# Patient Record
Sex: Male | Born: 1950 | Race: Black or African American | Hispanic: No | Marital: Single | Smoking: Former smoker
Health system: Southern US, Community
[De-identification: ages and names within clinical notes are randomized; demographics above are authoritative.]

## PROBLEM LIST (undated history)

## (undated) DIAGNOSIS — I2510 Atherosclerotic heart disease of native coronary artery without angina pectoris: Secondary | ICD-10-CM

## (undated) DIAGNOSIS — G4700 Insomnia, unspecified: Secondary | ICD-10-CM

## (undated) DIAGNOSIS — F0390 Unspecified dementia without behavioral disturbance: Secondary | ICD-10-CM

## (undated) DIAGNOSIS — K469 Unspecified abdominal hernia without obstruction or gangrene: Secondary | ICD-10-CM

## (undated) DIAGNOSIS — I10 Essential (primary) hypertension: Secondary | ICD-10-CM

## (undated) DIAGNOSIS — F1010 Alcohol abuse, uncomplicated: Secondary | ICD-10-CM

---

## 2004-01-20 ENCOUNTER — Emergency Department (HOSPITAL_COMMUNITY): Admission: EM | Admit: 2004-01-20 | Discharge: 2004-01-20 | Payer: Self-pay | Admitting: Emergency Medicine

## 2007-07-08 ENCOUNTER — Emergency Department (HOSPITAL_COMMUNITY): Admission: EM | Admit: 2007-07-08 | Discharge: 2007-07-08 | Payer: Self-pay | Admitting: Emergency Medicine

## 2008-04-09 ENCOUNTER — Encounter: Admission: RE | Admit: 2008-04-09 | Discharge: 2008-04-09 | Payer: Self-pay | Admitting: Gastroenterology

## 2010-10-30 ENCOUNTER — Encounter: Payer: Self-pay | Admitting: Gastroenterology

## 2011-03-24 ENCOUNTER — Emergency Department (HOSPITAL_COMMUNITY)
Admission: EM | Admit: 2011-03-24 | Discharge: 2011-03-24 | Disposition: A | Discharge status: Home / Self Care | Payer: Medicare Other | Attending: Emergency Medicine | Admitting: Emergency Medicine

## 2011-03-24 ENCOUNTER — Emergency Department (HOSPITAL_COMMUNITY): Payer: Medicare Other

## 2011-03-24 DIAGNOSIS — E119 Type 2 diabetes mellitus without complications: Secondary | ICD-10-CM | POA: Insufficient documentation

## 2011-03-24 DIAGNOSIS — M25539 Pain in unspecified wrist: Secondary | ICD-10-CM | POA: Insufficient documentation

## 2011-03-24 DIAGNOSIS — Z23 Encounter for immunization: Secondary | ICD-10-CM | POA: Insufficient documentation

## 2011-03-24 DIAGNOSIS — W208XXA Other cause of strike by thrown, projected or falling object, initial encounter: Secondary | ICD-10-CM | POA: Insufficient documentation

## 2011-03-24 DIAGNOSIS — S52609A Unspecified fracture of lower end of unspecified ulna, initial encounter for closed fracture: Secondary | ICD-10-CM | POA: Insufficient documentation

## 2011-03-24 DIAGNOSIS — I2510 Atherosclerotic heart disease of native coronary artery without angina pectoris: Secondary | ICD-10-CM | POA: Insufficient documentation

## 2011-07-20 LAB — DIFFERENTIAL
Eosinophils Relative: 2
Lymphocytes Relative: 32
Lymphs Abs: 1.8
Monocytes Absolute: 0.9 — ABNORMAL HIGH

## 2011-07-20 LAB — CBC
HCT: 43.1
Hemoglobin: 14.5
Platelets: 186
RBC: 5
WBC: 5.6

## 2011-07-20 LAB — BASIC METABOLIC PANEL
Chloride: 105
Creatinine, Ser: 0.79
GFR calc Af Amer: >60
Potassium: 3.6

## 2011-07-20 LAB — URINALYSIS, ROUTINE W REFLEX MICROSCOPIC
Glucose, UA: >1000 — AB
Ketones, ur: NEGATIVE
Leukocytes, UA: NEGATIVE
pH: 6

## 2012-06-09 ENCOUNTER — Emergency Department (HOSPITAL_COMMUNITY)
Admission: EM | Admit: 2012-06-09 | Discharge: 2012-06-10 | Disposition: A | Discharge status: Skilled Nursing Facility | Payer: Medicare Other | Attending: Emergency Medicine | Admitting: Emergency Medicine

## 2012-06-09 ENCOUNTER — Encounter (HOSPITAL_COMMUNITY): Payer: Self-pay | Admitting: *Deleted

## 2012-06-09 DIAGNOSIS — F919 Conduct disorder, unspecified: Secondary | ICD-10-CM | POA: Insufficient documentation

## 2012-06-09 DIAGNOSIS — F068 Other specified mental disorders due to known physiological condition: Secondary | ICD-10-CM | POA: Insufficient documentation

## 2012-06-09 DIAGNOSIS — Z79899 Other long term (current) drug therapy: Secondary | ICD-10-CM | POA: Insufficient documentation

## 2012-06-09 DIAGNOSIS — I10 Essential (primary) hypertension: Secondary | ICD-10-CM | POA: Insufficient documentation

## 2012-06-09 DIAGNOSIS — E119 Type 2 diabetes mellitus without complications: Secondary | ICD-10-CM | POA: Insufficient documentation

## 2012-06-09 DIAGNOSIS — I2510 Atherosclerotic heart disease of native coronary artery without angina pectoris: Secondary | ICD-10-CM | POA: Insufficient documentation

## 2012-06-09 DIAGNOSIS — F0390 Unspecified dementia without behavioral disturbance: Secondary | ICD-10-CM

## 2012-06-09 DIAGNOSIS — F29 Unspecified psychosis not due to a substance or known physiological condition: Secondary | ICD-10-CM | POA: Insufficient documentation

## 2012-06-09 HISTORY — DX: Unspecified dementia, unspecified severity, without behavioral disturbance, psychotic disturbance, mood disturbance, and anxiety: F03.90

## 2012-06-09 HISTORY — DX: Atherosclerotic heart disease of native coronary artery without angina pectoris: I25.10

## 2012-06-09 HISTORY — DX: Insomnia, unspecified: G47.00

## 2012-06-09 HISTORY — DX: Essential (primary) hypertension: I10

## 2012-06-09 HISTORY — DX: Unspecified abdominal hernia without obstruction or gangrene: K46.9

## 2012-06-09 LAB — CBC WITH DIFFERENTIAL/PLATELET
Eosinophils Absolute: 0.2 10*3/uL (ref 0.0–0.7)
Hemoglobin: 14.1 g/dL (ref 13.0–17.0)
Lymphs Abs: 1.9 10*3/uL (ref 0.7–4.0)
MCH: 29.1 pg (ref 26.0–34.0)
Monocytes Relative: 15 % — ABNORMAL HIGH (ref 3–12)
Neutro Abs: 3.5 10*3/uL (ref 1.7–7.7)
Neutrophils Relative %: 53 % (ref 43–77)
RBC: 4.84 MIL/uL (ref 4.22–5.81)

## 2012-06-09 LAB — RAPID URINE DRUG SCREEN, HOSP PERFORMED: Opiates: NOT DETECTED

## 2012-06-09 LAB — COMPREHENSIVE METABOLIC PANEL
Alkaline Phosphatase: 80 U/L (ref 39–117)
BUN: 11 mg/dL (ref 6–23)
CO2: 27 mEq/L (ref 19–32)
Chloride: 100 mEq/L (ref 96–112)
GFR calc Af Amer: >90 mL/min (ref >90)
GFR calc non Af Amer: >90 mL/min (ref >90)
Glucose, Bld: 195 mg/dL — ABNORMAL HIGH (ref 70–99)
Potassium: 4.2 mEq/L (ref 3.5–5.1)
Total Bilirubin: 0.2 mg/dL — ABNORMAL LOW (ref 0.3–1.2)

## 2012-06-09 MED ORDER — DONEPEZIL HCL 5 MG PO TABS
5.0000 mg | ORAL_TABLET | Freq: Every day | ORAL | Status: DC
  Filled 2012-06-09: qty 1

## 2012-06-09 MED ORDER — ASPIRIN 81 MG PO CHEW
81.0000 mg | CHEWABLE_TABLET | Freq: Every day | ORAL | Status: DC
  Administered 2012-06-09: 81 mg via ORAL
  Filled 2012-06-09: qty 1

## 2012-06-09 MED ORDER — CLOPIDOGREL BISULFATE 75 MG PO TABS
75.0000 mg | ORAL_TABLET | Freq: Every day | ORAL | Status: DC
  Filled 2012-06-09: qty 1

## 2012-06-09 MED ORDER — ACETAMINOPHEN 325 MG PO TABS: 650.0000 mg | ORAL_TABLET | ORAL | Status: DC | PRN

## 2012-06-09 MED ORDER — GLUCOSE-VITAMIN C 4-6 GM-MG PO CHEW: 1.0000 | CHEWABLE_TABLET | Freq: Every day | ORAL | Status: DC

## 2012-06-09 MED ORDER — NICOTINE 21 MG/24HR TD PT24
21.0000 mg | MEDICATED_PATCH | Freq: Every day | TRANSDERMAL | Status: DC
  Administered 2012-06-09: 21 mg via TRANSDERMAL
  Filled 2012-06-09: qty 1

## 2012-06-09 MED ORDER — METFORMIN HCL 500 MG PO TABS
1000.0000 mg | ORAL_TABLET | Freq: Three times a day (TID) | ORAL | Status: DC
  Filled 2012-06-09 (×2): qty 2

## 2012-06-09 MED ORDER — CLOPIDOGREL BISULFATE 75 MG PO TABS
75.0000 mg | ORAL_TABLET | Freq: Every day | ORAL | Status: DC
  Filled 2012-06-09 (×2): qty 1

## 2012-06-09 MED ORDER — METFORMIN HCL 500 MG PO TABS: 1000.0000 mg | ORAL_TABLET | Freq: Every day | ORAL | Status: DC

## 2012-06-09 MED ORDER — LORAZEPAM 1 MG PO TABS: 1.0000 mg | ORAL_TABLET | Freq: Three times a day (TID) | ORAL | Status: DC | PRN
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
## 2012-06-09 NOTE — ED Notes (Signed)
[id number]> Expected date:06/09/12<BR> Expected time: 5:47 PM<BR> Means of arrival:<BR> Comments:<BR>
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
## 2012-06-09 NOTE — ED Notes (Signed)
Patient has one patient belongings bag containing one pair of underwear, one shirt, tennis shoes, one pair of socks, one black necklace, one pack of cigarettes, one lighter, one billfold, and one pair of sweatpants. Belongings and patient wanded by security.

## 2012-06-09 NOTE — ED Notes (Signed)
Patient wanded by security and pt placed in paper scrubs
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
## 2012-06-10 LAB — URINALYSIS, ROUTINE W REFLEX MICROSCOPIC
Hgb urine dipstick: NEGATIVE
Leukocytes, UA: NEGATIVE
Nitrite: NEGATIVE
Protein, ur: 100 mg/dL — AB
Specific Gravity, Urine: 1.01 (ref 1.005–1.030)
Urobilinogen, UA: 2 mg/dL — ABNORMAL HIGH (ref 0.0–1.0)

## 2012-06-10 LAB — URINE MICROSCOPIC-ADD ON

## 2012-06-10 NOTE — ED Provider Notes (Signed)
The patient had a toe.  Psych consultation.  The psychiatrist, stated that the patient was safe to be released back to the skilled nursing facility.  I went back to reassess.  The patient is not psychotic.  There is no risk of suicidal or homicidal actions  Cheri Guppy, MD 06/10/12 [phone]

## 2012-06-12 NOTE — ED Provider Notes (Signed)
Medical screening examination/treatment/procedure(s) were performed by non-physician practitioner and as supervising physician I was immediately available for consultation/collaboration.  Bethany Cumming T Azael Ragain, MD 06/12/12 2057 

## 2012-11-11 ENCOUNTER — Encounter (HOSPITAL_COMMUNITY): Payer: Self-pay | Admitting: *Deleted

## 2012-11-11 ENCOUNTER — Emergency Department (HOSPITAL_COMMUNITY)
Admission: EM | Admit: 2012-11-11 | Discharge: 2012-11-11 | Disposition: A | Discharge status: Skilled Nursing Facility | Payer: Medicare Other | Attending: Emergency Medicine | Admitting: Emergency Medicine

## 2012-11-11 DIAGNOSIS — Z8719 Personal history of other diseases of the digestive system: Secondary | ICD-10-CM | POA: Insufficient documentation

## 2012-11-11 DIAGNOSIS — Z79899 Other long term (current) drug therapy: Secondary | ICD-10-CM | POA: Insufficient documentation

## 2012-11-11 DIAGNOSIS — E119 Type 2 diabetes mellitus without complications: Secondary | ICD-10-CM | POA: Insufficient documentation

## 2012-11-11 DIAGNOSIS — I2510 Atherosclerotic heart disease of native coronary artery without angina pectoris: Secondary | ICD-10-CM | POA: Insufficient documentation

## 2012-11-11 DIAGNOSIS — E559 Vitamin D deficiency, unspecified: Secondary | ICD-10-CM | POA: Insufficient documentation

## 2012-11-11 DIAGNOSIS — F0390 Unspecified dementia without behavioral disturbance: Secondary | ICD-10-CM | POA: Insufficient documentation

## 2012-11-11 DIAGNOSIS — I10 Essential (primary) hypertension: Secondary | ICD-10-CM | POA: Insufficient documentation

## 2012-11-11 DIAGNOSIS — Z7982 Long term (current) use of aspirin: Secondary | ICD-10-CM | POA: Insufficient documentation

## 2012-11-11 DIAGNOSIS — H04209 Unspecified epiphora, unspecified lacrimal gland: Secondary | ICD-10-CM | POA: Insufficient documentation

## 2012-11-11 DIAGNOSIS — K006 Disturbances in tooth eruption: Secondary | ICD-10-CM | POA: Insufficient documentation

## 2012-11-11 DIAGNOSIS — Z87898 Personal history of other specified conditions: Secondary | ICD-10-CM | POA: Insufficient documentation

## 2012-11-11 HISTORY — DX: Alcohol abuse, uncomplicated: F10.10

## 2012-11-11 LAB — POCT I-STAT, CHEM 8
BUN: 15 mg/dL (ref 6–23)
Calcium, Ion: 1.17 mmol/L (ref 1.13–1.30)
Chloride: 109 mEq/L (ref 96–112)
Creatinine, Ser: 1 mg/dL (ref 0.50–1.35)
TCO2: 26 mmol/L (ref 0–100)

## 2012-11-11 MED ORDER — LISINOPRIL 10 MG PO TABS
10.0000 mg | ORAL_TABLET | Freq: Every day | ORAL | Status: DC
  Administered 2012-11-11: 10 mg via ORAL
  Filled 2012-11-11: qty 1

## 2012-11-11 MED ORDER — LISINOPRIL 10 MG PO TABS: 10.0000 mg | ORAL_TABLET | Freq: Every day | ORAL | Status: DC

## 2012-11-11 NOTE — ED Provider Notes (Signed)
I saw and evaluated the patient, reviewed the resident's note and I agree with the findings and plan. Patient is here with the complaint of a mild headache and persistent hypertension. He had no deficits concerning for TIA today. He is well-appearing and has no deficits currently. Patient is currently not taking any medication for hypertension even though he has a history and should be on something. Looking back at prior notes he is been persistently hypertensive. Do not feel this is hypertensive emergency. Patient was given medication.  Gwyneth Sprout, MD 11/11/12 [phone]

## 2012-11-11 NOTE — ED Notes (Signed)
[id number]<JY> Expected date:<BR> Expected time:<BR> Means of arrival:<BR> Comments:<BR> Ems/ headache/ blurred vision

## 2012-11-11 NOTE — ED Provider Notes (Signed)
History     CSN: [id number]  Arrival date & time 11/11/12  1226   First MD Initiated Contact with Patient 11/11/12 1243      Chief Complaint  Patient presents with  . Headache    (Consider location/radiation/quality/duration/timing/severity/associated sxs/prior treatment) HPI Comments: 62 y.o PMH dementia, DM, HTN, CAD, insomnia, hernia, alcohol abuse, vitamin d deficiency, hernia asthma.  He presents from [facility].  He denies complaints at this time.  He states he put his head down to rest earlier and he was thinking.  He denies h/a, resolved blurry vision (baseline wears glasses).  He stated then he ended up in the ED.  ROS: negative for h/a, eye pain though his eyes are watery, resolved blurry vision, denies chest pain/sob.  Spoke with there nursing home they state that the patient stated he was not feeling well and put his head down.  His vital were checked and his BP was 180/118.  He is not on any antihypertensives.      The history is provided by the patient and the nursing home. No language interpreter was used.    Past Medical History  Diagnosis Date  . Dementia   . Diabetes mellitus   . Hypertension   . Coronary artery disease   . Insomnia   . Hernia   . Alcohol abuse     History reviewed. No pertinent past surgical history.  History reviewed. No pertinent family history.  History  Substance Use Topics  . Smoking status: Not on file  . Smokeless tobacco: Not on file  . Alcohol Use: No      Review of Systems  Constitutional: Negative for appetite change.  Eyes:       +eyes watering Blurred vision resolved.   Respiratory: Negative for shortness of breath.   Cardiovascular: Negative for chest pain.  Gastrointestinal: Negative for nausea, vomiting and abdominal pain.    Allergies  Review of patient's allergies indicates no known allergies.  Home Medications   Current Outpatient Rx  Name  Route  Sig  Dispense  Refill  . ASPIRIN 81 MG PO CHEW    Oral   Chew 81 mg by mouth daily.         Marland Kitchen VITAMIN D 1000 UNITS PO TABS   Oral   Take 1,000 Units by mouth daily.         Marland Kitchen CLOPIDOGREL BISULFATE 75 MG PO TABS   Oral   Take 75 mg by mouth daily.         . DONEPEZIL HCL 5 MG PO TABS   Oral   Take 5 mg by mouth daily.         Marland Kitchen GLIPIZIDE 5 MG PO TABS   Oral   Take 5 mg by mouth 2 (two) times daily.         Marland Kitchen METFORMIN HCL 1000 MG PO TABS   Oral   Take 1,000 mg by mouth 3 (three) times daily.         . ADULT MULTIVITAMIN W/MINERALS CH   Oral   Take 1 tablet by mouth daily.           BP 168/130  Pulse 86  Temp 97.5 F (36.4 C) (Oral)  Resp 20  SpO2 99%  Physical Exam  Nursing note and vitals reviewed. Constitutional: He is oriented to person, place, and time. He appears well-developed and well-nourished. He is cooperative. No distress.  HENT:  Head: Normocephalic and atraumatic.  Mouth/Throat: Oropharynx is  clear and moist and mucous membranes are normal. Abnormal dentition. No oropharyngeal exudate.  Eyes: Conjunctivae normal are normal. Pupils are equal, round, and reactive to light. Right eye exhibits no discharge. Left eye exhibits no discharge. No scleral icterus.  Cardiovascular: Normal rate, regular rhythm, S1 normal, S2 normal and normal heart sounds.   No murmur heard. Pulmonary/Chest: Effort normal and breath sounds normal. No respiratory distress. He has no wheezes.  Abdominal: Soft. Bowel sounds are normal. He exhibits no distension. There is no tenderness.  Neurological: He is alert and oriented to person, place, and time. He has normal strength. No cranial nerve deficit or sensory deficit.  Skin: Skin is warm, dry and intact. No rash noted. He is not diaphoretic.  Psychiatric: He has a normal mood and affect. His speech is normal and behavior is normal. Thought content normal.       Oriented to year, not month, not age.  Aware this is his birthday month when he was told it was Feb.       ED Course  Procedures (including critical care time)  Labs Reviewed - No data to display No results found.   No diagnosis found.    MDM  Hypertension -neurologically intact  1. With history of DM will start ACEI, istat to check kidney function which was normal  2. Follow up with PCP at [facility] who can titrate BP medication as needed within the next 1 week   Shirlee Latch MD [phone]         Annett Gula, MD 11/11/12 1335  Annett Gula, MD 11/11/12 [phone]

## 2012-11-11 NOTE — ED Notes (Signed)
Per EMS:  Pt from [facility].  States that he started to have a HA and blurred vision that started today.  Denies N/V.  Pt did take medications as usual today-HTN.

## 2014-07-19 ENCOUNTER — Encounter (HOSPITAL_COMMUNITY): Payer: Self-pay | Admitting: Emergency Medicine

## 2014-07-19 ENCOUNTER — Emergency Department (HOSPITAL_COMMUNITY)
Admission: EM | Admit: 2014-07-19 | Discharge: 2014-07-20 | Disposition: A | Discharge status: Home / Self Care | Payer: Medicare Other | Attending: Emergency Medicine | Admitting: Emergency Medicine

## 2014-07-19 ENCOUNTER — Emergency Department (HOSPITAL_COMMUNITY): Payer: Medicare Other

## 2014-07-19 DIAGNOSIS — Z8669 Personal history of other diseases of the nervous system and sense organs: Secondary | ICD-10-CM | POA: Diagnosis not present

## 2014-07-19 DIAGNOSIS — F0391 Unspecified dementia with behavioral disturbance: Secondary | ICD-10-CM | POA: Diagnosis not present

## 2014-07-19 DIAGNOSIS — Z7902 Long term (current) use of antithrombotics/antiplatelets: Secondary | ICD-10-CM | POA: Diagnosis not present

## 2014-07-19 DIAGNOSIS — Z8719 Personal history of other diseases of the digestive system: Secondary | ICD-10-CM | POA: Diagnosis not present

## 2014-07-19 DIAGNOSIS — Z79899 Other long term (current) drug therapy: Secondary | ICD-10-CM | POA: Diagnosis not present

## 2014-07-19 DIAGNOSIS — Z7982 Long term (current) use of aspirin: Secondary | ICD-10-CM | POA: Insufficient documentation

## 2014-07-19 DIAGNOSIS — E119 Type 2 diabetes mellitus without complications: Secondary | ICD-10-CM | POA: Insufficient documentation

## 2014-07-19 DIAGNOSIS — R4182 Altered mental status, unspecified: Secondary | ICD-10-CM | POA: Diagnosis present

## 2014-07-19 DIAGNOSIS — I10 Essential (primary) hypertension: Secondary | ICD-10-CM | POA: Diagnosis not present

## 2014-07-19 DIAGNOSIS — F4324 Adjustment disorder with disturbance of conduct: Secondary | ICD-10-CM | POA: Diagnosis present

## 2014-07-19 DIAGNOSIS — I2510 Atherosclerotic heart disease of native coronary artery without angina pectoris: Secondary | ICD-10-CM | POA: Diagnosis not present

## 2014-07-19 DIAGNOSIS — Z720 Tobacco use: Secondary | ICD-10-CM | POA: Insufficient documentation

## 2014-07-19 DIAGNOSIS — R451 Restlessness and agitation: Secondary | ICD-10-CM | POA: Diagnosis present

## 2014-07-19 LAB — CBC WITH DIFFERENTIAL/PLATELET
BASOS ABS: 0 10*3/uL (ref 0.0–0.1)
BASOS PCT: 0 % (ref 0–1)
EOS ABS: 0 10*3/uL (ref 0.0–0.7)
Eosinophils Relative: 0 % (ref 0–5)
HCT: 39.1 % (ref 39.0–52.0)
Hemoglobin: 13.2 g/dL (ref 13.0–17.0)
LYMPHS ABS: 0.6 10*3/uL — AB (ref 0.7–4.0)
Lymphocytes Relative: 9 % — ABNORMAL LOW (ref 12–46)
MCH: 28.6 pg (ref 26.0–34.0)
MCHC: 33.8 g/dL (ref 30.0–36.0)
MCV: 84.6 fL (ref 78.0–100.0)
Monocytes Absolute: 0.6 10*3/uL (ref 0.1–1.0)
Monocytes Relative: 8 % (ref 3–12)
NEUTROS PCT: 83 % — AB (ref 43–77)
Neutro Abs: 6.1 10*3/uL (ref 1.7–7.7)
PLATELETS: 223 10*3/uL (ref 150–400)
RBC: 4.62 MIL/uL (ref 4.22–5.81)
RDW: 13.1 % (ref 11.5–15.5)
WBC: 7.3 10*3/uL (ref 4.0–10.5)

## 2014-07-19 LAB — URINALYSIS, ROUTINE W REFLEX MICROSCOPIC
BILIRUBIN URINE: NEGATIVE
Ketones, ur: NEGATIVE mg/dL
Leukocytes, UA: NEGATIVE
Nitrite: POSITIVE — AB
PH: 5.5 (ref 5.0–8.0)
Protein, ur: 100 mg/dL — AB
SPECIFIC GRAVITY, URINE: 1.015 (ref 1.005–1.030)
Urobilinogen, UA: 1 mg/dL (ref 0.0–1.0)

## 2014-07-19 LAB — URINE MICROSCOPIC-ADD ON

## 2014-07-19 LAB — RAPID URINE DRUG SCREEN, HOSP PERFORMED
AMPHETAMINES: NOT DETECTED
Barbiturates: NOT DETECTED
Benzodiazepines: NOT DETECTED
COCAINE: NOT DETECTED
OPIATES: NOT DETECTED
TETRAHYDROCANNABINOL: NOT DETECTED

## 2014-07-19 LAB — COMPREHENSIVE METABOLIC PANEL
ALBUMIN: 3.2 g/dL — AB (ref 3.5–5.2)
ALK PHOS: 86 U/L (ref 39–117)
ALT: 20 U/L (ref 0–53)
AST: 28 U/L (ref 0–37)
Anion gap: 14 (ref 5–15)
BUN: 9 mg/dL (ref 6–23)
CO2: 24 mEq/L (ref 19–32)
Calcium: 9.3 mg/dL (ref 8.4–10.5)
Chloride: 100 mEq/L (ref 96–112)
Creatinine, Ser: 1 mg/dL (ref 0.50–1.35)
GFR calc Af Amer: 90 mL/min — ABNORMAL LOW (ref >90)
GFR calc non Af Amer: 78 mL/min — ABNORMAL LOW (ref >90)
GLUCOSE: 251 mg/dL — AB (ref 70–99)
POTASSIUM: 3.9 meq/L (ref 3.7–5.3)
SODIUM: 138 meq/L (ref 137–147)
TOTAL PROTEIN: 7.8 g/dL (ref 6.0–8.3)
Total Bilirubin: 0.3 mg/dL (ref 0.3–1.2)

## 2014-07-19 LAB — CBG MONITORING, ED: Glucose-Capillary: 258 mg/dL — ABNORMAL HIGH (ref 70–99)

## 2014-07-19 LAB — ETHANOL

## 2014-07-19 MED ORDER — NICOTINE 21 MG/24HR TD PT24
21.0000 mg | MEDICATED_PATCH | Freq: Every day | TRANSDERMAL | Status: DC
  Administered 2014-07-19 – 2014-07-20 (×2): 21 mg via TRANSDERMAL
  Filled 2014-07-19 (×2): qty 1

## 2014-07-19 MED ORDER — ALUM & MAG HYDROXIDE-SIMETH 200-200-20 MG/5ML PO SUSP: 30.0000 mL | ORAL | Status: DC | PRN

## 2014-07-19 MED ORDER — IBUPROFEN 200 MG PO TABS
600.0000 mg | ORAL_TABLET | Freq: Three times a day (TID) | ORAL | Status: DC | PRN
Start: 2014-07-19 — End: 2014-07-20
  Administered 2014-07-19: 600 mg via ORAL
  Filled 2014-07-19: qty 3

## 2014-07-19 MED ORDER — ONDANSETRON HCL 4 MG PO TABS: 4.0000 mg | ORAL_TABLET | Freq: Three times a day (TID) | ORAL | Status: DC | PRN

## 2014-07-19 MED ORDER — ZOLPIDEM TARTRATE 5 MG PO TABS: 5.0000 mg | ORAL_TABLET | Freq: Every evening | ORAL | Status: DC | PRN

## 2014-07-19 MED ORDER — ACETAMINOPHEN 325 MG PO TABS: 650.0000 mg | ORAL_TABLET | ORAL | Status: DC | PRN

## 2014-07-19 NOTE — ED Notes (Signed)
Patient tried to urinate, was unsuccessful will try again later

## 2014-07-19 NOTE — ED Notes (Signed)
Pt c/o pain in LUQ and LLE. He has local petichiae on his left costal region. Assessed a hematoma on the LLE, lateral aspect of his thigh. He continues to rub this area.

## 2014-07-19 NOTE — BH Assessment (Signed)
Assessment completed. Consulted Antonio Levelsori Bukett, NP who recommended inpatient treatment. TTS should seek geri-psych placement. Dr. Hyacinth MeekerMiller has been informed of the recommendation.
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
## 2014-07-19 NOTE — ED Notes (Signed)
Patient wanded by security. 

## 2014-07-19 NOTE — ED Notes (Signed)
Patient has no walker with him and patient stated that he does not use a walker. Patient is up walking with no assistance from staff.
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
## 2014-07-19 NOTE — ED Notes (Signed)
Pt sister called to inquire pt being on medication to keep him relaxed and controlled. She states she has been the caregiver of her brother for 8 years. She has only had this behavior from him one time prior. He has not been on medication that she knows of since 2013, when [facility] SN[facility].
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
## 2014-07-19 NOTE — ED Notes (Signed)
Patient unable to verbalize why he is in the ER. No complaints. Resting quietly at this time.

## 2014-07-19 NOTE — ED Notes (Signed)
Sister:  Barbie BannerValerie Galloway  [phone]

## 2014-07-20 ENCOUNTER — Encounter (HOSPITAL_COMMUNITY): Payer: Self-pay | Admitting: Psychiatry

## 2014-07-20 DIAGNOSIS — F4324 Adjustment disorder with disturbance of conduct: Secondary | ICD-10-CM

## 2014-07-20 DIAGNOSIS — R451 Restlessness and agitation: Secondary | ICD-10-CM

## 2014-07-20 LAB — CBG MONITORING, ED: Glucose-Capillary: 142 mg/dL — ABNORMAL HIGH (ref 70–99)

## 2014-07-20 MED ORDER — RISPERIDONE 0.5 MG PO TABS
0.5000 mg | ORAL_TABLET | Freq: Two times a day (BID) | ORAL | Status: DC
  Administered 2014-07-20: 0.5 mg via ORAL
  Filled 2014-07-20: qty 1

## 2014-07-20 MED ORDER — HYDROCHLOROTHIAZIDE 12.5 MG PO CAPS
12.5000 mg | ORAL_CAPSULE | Freq: Every day | ORAL | Status: DC
  Administered 2014-07-20: 12.5 mg via ORAL
  Filled 2014-07-20: qty 1

## 2014-07-20 MED ORDER — GLIPIZIDE 5 MG PO TABS: 5.0000 mg | ORAL_TABLET | Freq: Two times a day (BID) | ORAL | Status: AC

## 2014-07-20 MED ORDER — METOPROLOL TARTRATE 50 MG PO TABS: 50.0000 mg | ORAL_TABLET | Freq: Two times a day (BID) | ORAL | Status: AC

## 2014-07-20 MED ORDER — RISPERIDONE 0.5 MG PO TABS: 0.5000 mg | ORAL_TABLET | Freq: Two times a day (BID) | ORAL | Status: AC

## 2014-07-20 MED ORDER — FENOFIBRATE 48 MG PO TABS: 48.0000 mg | ORAL_TABLET | Freq: Every day | ORAL | Status: AC

## 2014-07-20 MED ORDER — CLOPIDOGREL BISULFATE 75 MG PO TABS
75.0000 mg | ORAL_TABLET | Freq: Every day | ORAL | Status: DC
  Administered 2014-07-20: 75 mg via ORAL
  Filled 2014-07-20: qty 1

## 2014-07-20 MED ORDER — LISINOPRIL-HYDROCHLOROTHIAZIDE 10-12.5 MG PO TABS: 1.0000 | ORAL_TABLET | Freq: Every morning | ORAL | Status: DC

## 2014-07-20 MED ORDER — LISINOPRIL-HYDROCHLOROTHIAZIDE 10-12.5 MG PO TABS: 1.0000 | ORAL_TABLET | Freq: Every morning | ORAL | Status: AC

## 2014-07-20 MED ORDER — FENOFIBRATE 54 MG PO TABS
54.0000 mg | ORAL_TABLET | Freq: Every day | ORAL | Status: DC
  Administered 2014-07-20: 54 mg via ORAL
  Filled 2014-07-20: qty 1

## 2014-07-20 MED ORDER — LISINOPRIL 10 MG PO TABS
10.0000 mg | ORAL_TABLET | Freq: Every day | ORAL | Status: DC
  Administered 2014-07-20: 10 mg via ORAL
  Filled 2014-07-20: qty 1

## 2014-07-20 MED ORDER — METOPROLOL TARTRATE 25 MG PO TABS
50.0000 mg | ORAL_TABLET | Freq: Two times a day (BID) | ORAL | Status: DC
  Administered 2014-07-20: 50 mg via ORAL
  Filled 2014-07-20: qty 2

## 2014-07-20 MED ORDER — CLOPIDOGREL BISULFATE 75 MG PO TABS: 75.0000 mg | ORAL_TABLET | Freq: Every day | ORAL | Status: AC

## 2014-07-20 MED ORDER — GLIPIZIDE 5 MG PO TABS
5.0000 mg | ORAL_TABLET | Freq: Two times a day (BID) | ORAL | Status: DC
  Administered 2014-07-20: 5 mg via ORAL
  Filled 2014-07-20 (×3): qty 1

## 2014-07-20 NOTE — ED Notes (Signed)
Patient is resting comfortably. 
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
## 2014-07-20 NOTE — ED Provider Notes (Signed)
The patient is awaiting inpatient psychiatric treatment.  Home meds have not been ordered initially. Patient is mildly hypertensive this morning.  I have reordered his home medications. We will monitor his CBG daily.  Linwood DibblesJon Christianjames Soule, MD 07/20/14 1047

## 2014-07-20 NOTE — Progress Notes (Signed)
MHT contacted gero-psych facilities with placement for pt:  FAX REFERRAL 1)[facility]-faxed referral; family prefer facility and PCP made a call to facility prior to ED encounter 2)[facility]-able to take dementia dx per AC, faxed referral  DECLINED DUE TO NO DEMENTIA PROGRAMMING 1)[facility] 2)[facility]  Shelia Magallon L Latham Kinzler, MHT/NS

## 2014-07-20 NOTE — Progress Notes (Signed)
07/20/2014 A. Tarus Briski RNCM 1909pm EDCM spoke to patient's sister [name] at bedside and provided her with the home health agency list and privated duty agency list.  Address correction made and documented.  Patient's [place] [address] [place] [id number] [place] [id number].  Patient's sister confirms patient has Medicaid however, patient has lost his wallet and will need to get a new ID and Medicaid card.  Patient's sister will assist with this process. Patient's sister has no further questions at this time.  [facility] 1822[facility] with confirmation of receipt at 1825pm .  No further EDCM needs at this time.

## 2014-07-20 NOTE — Progress Notes (Signed)
  CARE MANAGEMENT ED NOTE 07/20/2014  Patient:  Antonio Galloway,Antonio Galloway   Account Number:  [id number]  Date Initiated:  07/20/2014  Documentation initiated by:  Radford PaxFERRERO,Damarco Keysor  Subjective/Objective Assessment:   Patient presents to Ed with increased aggression     Subjective/Objective Assessment Detail:   Patient noted to have the mental capacity of a [age] year old.  Patient with pmhx of dementia, DM, CAD, Insomnia, hernia, ETOH abuse     Action/Plan:   Patient to be discharged home with his sister   Action/Plan Detail:   Anticipated DC Date:  07/20/2014     Status Recommendation to Physician:   Result of Recommendation:    Other ED Services  Consult Working Plan   In-house referral  Clinical Social Worker   DC Planning Services  CM consult  Other  PCP issues   [facility]  HOME HEALTH   Choice offered to / List presented to:  C-1 Patient     HH arranged  HH-1 RN  HH-2 PT  HH-4 NURSE'S AIDE  HH-6 SOCIAL WORKER      [facility]    Status of service:  Completed, signed off  ED Comments:   ED Comments Detail:  [facility].  EDCM psoke to patient at bedside.  Patient agreeable to home health services.  EDCM discussed patient with Catha NottinghamJamison NP Psychiatry who reports patient is cleared for discharge from psychiatric point of view.  As per Jamision, patient is very low fuctioning, patient to go home with his sister. Discussed patient with EDP who placed home health orders with face to face.  EDCM called patient's sister Antonio Galloway at [phone].  Antonio Galloway confirms that the patient will be coming to live with her and she will be picking him up shortly.  Antonio Galloway also confirms patient's pcp is Antonio Galloway.  EDCM updated EDRN.  EDCM placed [place] nursing agencies.  [facility].  Awaiting for patient's sister for  discharge.  No further EDCM needs at this time.

## 2014-07-20 NOTE — [facility] Suicide Risk Assessment (Signed)
Suicide Risk Assessment  Discharge Assessment     Demographic Factors:  Male  Total Time spent with patient: 20 minutes  Psychiatric Specialty Exam:     Blood pressure 169/77, pulse 64, temperature 97.9 F (36.6 C), temperature source Oral, resp. rate 18, SpO2 100.00%.There is no height or weight on file to calculate BMI.  General Appearance: Casual  Eye Contact::  Good  Speech:  Normal Rate  Volume:  Normal  Mood:  Euthymic  Affect:  Congruent  Thought Process:  Coherent  Orientation:  Full (Time, Place, and Person)  Thought Content:  WDL  Suicidal Thoughts:  No  Homicidal Thoughts:  No  Memory:  Immediate;   Good Recent;   Good Remote;   Good  Judgement:  Fair  Insight:  Fair  Psychomotor Activity:  Normal  Concentration:  Fair  Recall:  Fis[employer]Fair  Fund of Knowledge:Fair  Language: Fair  Akathisia:  No  Handed:  Right  AIMS (if indicated):     Assets:  Desire for Improvement Housing Leisure Time Resilience Social Support  Sleep:      Musculoskeletal: Strength & Muscle Tone: within normal limits Gait & Station: normal Patient leans: N/A  Mental Status Per Nursing Assessment::   On Admission:   agitated  Current Mental Status by Physician: NA  Loss Factors: NA  Historical Factors: Impulsivity  Risk Reduction Factors:   Sense of responsibility to family, Living with another person, especially a relative and Positive social support  Continued Clinical Symptoms:  None  Cognitive Features That Contribute To Risk:  Limited cognitively  Suicide Risk:  Minimal: No identifiable suicidal ideation.  Patients presenting with no risk factors but with morbid ruminations; may be classified as minimal risk based on the severity of the depressive symptoms  Discharge Diagnoses:   AXIS I:  Adjustment Disorder with Disturbance of Conduct AXIS II:  Deferred AXIS III:   Past Medical History  Diagnosis Date  . Dementia   . Diabetes mellitus   . Hypertension    . Coronary artery disease   . Insomnia   . Hernia   . Alcohol abuse    AXIS IV:  housing problems, other psychosocial or environmental problems, problems related to social environment and problems with primary support group AXIS V:  61-70 mild symptoms  Plan Of Care/Follow-up recommendations:  Activity:  as tolerated Diet:  low-sodium heart healthy diet  Is patient on multiple antipsychotic therapies at discharge:  No   Has Patient had three or more failed trials of antipsychotic monotherapy by history:  No  Recommended Plan for Multiple Antipsychotic Therapies: NA    Tiffeny Minchew, PMH-NP 07/20/2014, 1:27 PM
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
## 2014-07-21 NOTE — Progress Notes (Signed)
CSW received [facility] [place] [facility] about [facility] home health and plan after returning home. Pt social worker directed back to family as patietn discharged home with sister.   Byrd HesselbachKristen Laiklyn Pilkenton, LCSW [phone]  ED CSW 07/21/2014 [phone]

## 2014-10-28 ENCOUNTER — Emergency Department (HOSPITAL_BASED_OUTPATIENT_CLINIC_OR_DEPARTMENT_OTHER)
Admission: EM | Admit: 2014-10-28 | Discharge: 2014-10-28 | Disposition: A | Discharge status: Home / Self Care | Payer: Medicare Other | Attending: Emergency Medicine | Admitting: Emergency Medicine

## 2014-10-28 ENCOUNTER — Encounter (HOSPITAL_BASED_OUTPATIENT_CLINIC_OR_DEPARTMENT_OTHER): Payer: Self-pay | Admitting: *Deleted

## 2014-10-28 DIAGNOSIS — J3489 Other specified disorders of nose and nasal sinuses: Secondary | ICD-10-CM

## 2014-10-28 DIAGNOSIS — I10 Essential (primary) hypertension: Secondary | ICD-10-CM | POA: Diagnosis not present

## 2014-10-28 DIAGNOSIS — Z87891 Personal history of nicotine dependence: Secondary | ICD-10-CM | POA: Diagnosis not present

## 2014-10-28 DIAGNOSIS — Z7902 Long term (current) use of antithrombotics/antiplatelets: Secondary | ICD-10-CM | POA: Diagnosis not present

## 2014-10-28 DIAGNOSIS — Z79899 Other long term (current) drug therapy: Secondary | ICD-10-CM | POA: Diagnosis not present

## 2014-10-28 DIAGNOSIS — F0390 Unspecified dementia without behavioral disturbance: Secondary | ICD-10-CM | POA: Insufficient documentation

## 2014-10-28 DIAGNOSIS — I2510 Atherosclerotic heart disease of native coronary artery without angina pectoris: Secondary | ICD-10-CM | POA: Diagnosis not present

## 2014-10-28 DIAGNOSIS — Z8719 Personal history of other diseases of the digestive system: Secondary | ICD-10-CM | POA: Insufficient documentation

## 2014-10-28 DIAGNOSIS — E119 Type 2 diabetes mellitus without complications: Secondary | ICD-10-CM | POA: Insufficient documentation

## 2014-10-28 MED ORDER — PSEUDOEPHEDRINE HCL ER 120 MG PO TB12: 120.0000 mg | ORAL_TABLET | Freq: Two times a day (BID) | ORAL | Status: AC

## 2014-10-28 NOTE — Discharge Instructions (Signed)
Upper Respiratory Infection, Adult An upper respiratory infection (URI) is also sometimes known as the common cold. The upper respiratory tract includes the nose, sinuses, throat, trachea, and bronchi. Bronchi are the airways leading to the lungs. Most people improve within 1 week, but symptoms can last up to 2 weeks. A residual cough may last even longer.  CAUSES Many different viruses can infect the tissues lining the upper respiratory tract. The tissues become irritated and inflamed and often become very moist. Mucus production is also common. A cold is contagious. You can easily spread the virus to others by oral contact. This includes kissing, sharing a glass, coughing, or sneezing. Touching your mouth or nose and then touching a surface, which is then touched by another person, can also spread the virus. SYMPTOMS  Symptoms typically develop 1 to 3 days after you come in contact with a cold virus. Symptoms vary from person to person. They may include:  Runny nose.  Sneezing.  Nasal congestion.  Sinus irritation.  Sore throat.  Loss of voice (laryngitis).  Cough.  Fatigue.  Muscle aches.  Loss of appetite.  Headache.  Low-grade fever. DIAGNOSIS  You might diagnose your own cold based on familiar symptoms, since most people get a cold 2 to 3 times a year. Your caregiver can confirm this based on your exam. Most importantly, your caregiver can check that your symptoms are not due to another disease such as strep throat, sinusitis, pneumonia, asthma, or epiglottitis. Blood tests, throat tests, and X-rays are not necessary to diagnose a common cold, but they may sometimes be helpful in excluding other more serious diseases. Your caregiver will decide if any further tests are required. RISKS AND COMPLICATIONS  You may be at risk for a more severe case of the common cold if you smoke cigarettes, have chronic heart disease (such as heart failure) or lung disease (such as asthma), or if  you have a weakened immune system. The very young and very old are also at risk for more serious infections. Bacterial sinusitis, middle ear infections, and bacterial pneumonia can complicate the common cold. The common cold can worsen asthma and chronic obstructive pulmonary disease (COPD). Sometimes, these complications can require emergency medical care and may be life-threatening. PREVENTION  The best way to protect against getting a cold is to practice good hygiene. Avoid oral or hand contact with people with cold symptoms. Wash your hands often if contact occurs. There is no clear evidence that vitamin C, vitamin E, echinacea, or exercise reduces the chance of developing a cold. However, it is always recommended to get plenty of rest and practice good nutrition. TREATMENT  Treatment is directed at relieving symptoms. There is no cure. Antibiotics are not effective, because the infection is caused by a virus, not by bacteria. Treatment may include:  Increased fluid intake. Sports drinks offer valuable electrolytes, sugars, and fluids.  Breathing heated mist or steam (vaporizer or shower).  Eating chicken soup or other clear broths, and maintaining good nutrition.  Getting plenty of rest.  Using gargles or lozenges for comfort.  Controlling fevers with ibuprofen or acetaminophen as directed by your caregiver.  Increasing usage of your inhaler if you have asthma. Zinc gel and zinc lozenges, taken in the first 24 hours of the common cold, can shorten the duration and lessen the severity of symptoms. Pain medicines may help with fever, muscle aches, and throat pain. A variety of non-prescription medicines are available to treat congestion and runny nose. Your caregiver   can make recommendations and may suggest nasal or lung inhalers for other symptoms.  HOME CARE INSTRUCTIONS   Only take over-the-counter or prescription medicines for pain, discomfort, or fever as directed by your  caregiver.  Use a warm mist humidifier or inhale steam from a shower to increase air moisture. This may keep secretions moist and make it easier to breathe.  Drink enough water and fluids to keep your urine clear or pale yellow.  Rest as needed.  Return to work when your temperature has returned to normal or as your caregiver advises. You may need to stay home longer to avoid infecting others. You can also use a face mask and careful hand washing to prevent spread of the virus. SEEK MEDICAL CARE IF:   After the first few days, you feel you are getting worse rather than better.  You need your caregiver's advice about medicines to control symptoms.  You develop chills, worsening shortness of breath, or brown or red sputum. These may be signs of pneumonia.  You develop yellow or brown nasal discharge or pain in the face, especially when you bend forward. These may be signs of sinusitis.  You develop a fever, swollen neck glands, pain with swallowing, or white areas in the back of your throat. These may be signs of strep throat. SEEK IMMEDIATE MEDICAL CARE IF:   You have a fever.  You develop severe or persistent headache, ear pain, sinus pain, or chest pain.  You develop wheezing, a prolonged cough, cough up blood, or have a change in your usual mucus (if you have chronic lung disease).  You develop sore muscles or a stiff neck. Document Released: 03/21/2001 Document Revised: 12/18/2011 Document Reviewed: 12/31/2013 Exit[employer] Patient Information 2015 [employer]. This information is not intended to replace advice given to you by your health care provider. Make sure you discuss any questions you have with your health care provider.  

## 2014-10-28 NOTE — ED Notes (Signed)
Pt c/o URI symptoms x 1 week 

## 2014-10-28 NOTE — ED Provider Notes (Signed)
CSN: [id number]     Arrival date & time 10/28/14  1929 History  This chart was scribed for Antonio DibblesJon Tiffeny Minchew, MD by Freida Busmaniana Omoyeni, ED Scribe. This patient was seen in room MH08/MH08 and the patient's care was started 8:10 PM.    Chief Complaint  Patient presents with  . URI      Patient is a 64 y.o. male presenting with URI. The history is provided by a relative and a caregiver. The history is limited by the condition of the patient. No language interpreter was used.  URI Presenting symptoms: rhinorrhea   Presenting symptoms: no fever   Severity:  Moderate Onset quality:  Gradual Duration:  1 week Timing:  Constant Relieved by:  Nothing Associated symptoms: no sneezing and no wheezing   Risk factors: being elderly      HPI Comments:  Antonio Galloway is a 64 y.o. male with who presents to the Emergency Department complaining of  rhinorrhea for 3 weeks. Sister denies fever. His sister has similar symptoms at this time.  No alleviating factors noted.   Past Medical History  Diagnosis Date  . Dementia   . Diabetes mellitus   . Hypertension   . Coronary artery disease   . Insomnia   . Hernia   . Alcohol abuse    History reviewed. No pertinent past surgical history. History reviewed. No pertinent family history. History  Substance Use Topics  . Smoking status: Former Games developermoker  . Smokeless tobacco: Not on file  . Alcohol Use: No    Review of Systems  Unable to perform ROS: Dementia  Constitutional: Negative for fever.  HENT: Positive for rhinorrhea. Negative for sneezing.   Respiratory: Negative for wheezing.   All other systems reviewed and are negative.     Allergies  Review of patient's allergies indicates no known allergies.  Home Medications   Prior to Admission medications   Medication Sig Start Date End Date Taking? Authorizing Provider  cetirizine (ZYRTEC) 10 MG tablet Take 10 mg by mouth daily as needed for allergies.    Historical Provider, MD  clopidogrel  (PLAVIX) 75 MG tablet Take 1 tablet (75 mg total) by mouth daily. 07/20/14   Nanine MeansJamison Lord, NP  fenofibrate (TRICOR) 48 MG tablet Take 1 tablet (48 mg total) by mouth at bedtime. 07/20/14   Nanine MeansJamison Lord, NP  glipiZIDE (GLUCOTROL) 5 MG tablet Take 1 tablet (5 mg total) by mouth 2 (two) times daily. 07/20/14   Nanine MeansJamison Lord, NP  lisinopril-hydrochlorothiazide (PRINZIDE,ZESTORETIC) 10-12.5 MG per tablet Take 1 tablet by mouth every morning. 07/20/14   Nanine MeansJamison Lord, NP  metoprolol (LOPRESSOR) 50 MG tablet Take 1 tablet (50 mg total) by mouth 2 (two) times daily. 07/20/14   Nanine MeansJamison Lord, NP  risperiDONE (RISPERDAL) 0.5 MG tablet Take 1 tablet (0.5 mg total) by mouth 2 (two) times daily. 07/20/14   Nanine MeansJamison Lord, NP   BP 149/97 mmHg  Pulse 57  Temp(Src) 97.9 F (36.6 C) (Axillary)  Resp 16  Ht 5\' 11"  (1.803 m)  Wt 142 lb (64.411 kg)  BMI 19.81 kg/m2  SpO2 100% Physical Exam  Constitutional: He appears well-developed and well-nourished. No distress.  HENT:  Head: Normocephalic and atraumatic.  Right Ear: External ear normal.  Left Ear: External ear normal.  Nose: Rhinorrhea present.  Clear nasal drainage  Eyes: Conjunctivae are normal. Right eye exhibits no discharge. Left eye exhibits no discharge. No scleral icterus.  Neck: Neck supple. No tracheal deviation present.  Cardiovascular: Normal rate, regular rhythm  and intact distal pulses.   Pulmonary/Chest: Effort normal and breath sounds normal. No stridor. No respiratory distress. He has no wheezes. He has no rales.  Abdominal: Soft. Bowel sounds are normal. He exhibits no distension. There is no tenderness. There is no rebound and no guarding.  Musculoskeletal: He exhibits no edema or tenderness.  Neurological: He is alert. He has normal strength. No cranial nerve deficit (no facial droop, extraocular movements intact, no slurred speech) or sensory deficit. He exhibits normal muscle tone. He displays no seizure activity. Coordination normal.   Skin: Skin is warm and dry. No rash noted.  Psychiatric: He has a normal mood and affect.  Nursing note and vitals reviewed.   ED Course  Procedures   DIAGNOSTIC STUDIES:  Oxygen Saturation is 100% on RA, normal by my interpretation.    COORDINATION OF CARE:  8:17 PM Discussed treatment plan with pt and wife at bedside and pt agreed to plan.  Labs Review Labs Reviewed - No data to display  Imaging Review No results found.   EKG Interpretation None      MDM   Final diagnoses:  Rhinorrhea    Most likely viral.  At this time there does not appear to be any evidence of an acute emergency medical condition and the patient appears stable for discharge with appropriate outpatient follow up. Sudafed rx  I personally performed the services described in this documentation, which was scribed in my presence.  The recorded information has been reviewed and is accurate.      Antonio Dibbles, MD 10/28/14 [phone]

## 2014-12-24 ENCOUNTER — Encounter (HOSPITAL_COMMUNITY): Payer: Self-pay | Admitting: *Deleted

## 2014-12-24 ENCOUNTER — Emergency Department (HOSPITAL_COMMUNITY): Payer: Medicare Other

## 2014-12-24 ENCOUNTER — Emergency Department (HOSPITAL_COMMUNITY)
Admission: EM | Admit: 2014-12-24 | Discharge: 2014-12-24 | Disposition: A | Discharge status: Home / Self Care | Payer: Medicare Other | Attending: Emergency Medicine | Admitting: Emergency Medicine

## 2014-12-24 ENCOUNTER — Other Ambulatory Visit: Payer: Self-pay

## 2014-12-24 DIAGNOSIS — Z8719 Personal history of other diseases of the digestive system: Secondary | ICD-10-CM | POA: Insufficient documentation

## 2014-12-24 DIAGNOSIS — E11649 Type 2 diabetes mellitus with hypoglycemia without coma: Secondary | ICD-10-CM | POA: Insufficient documentation

## 2014-12-24 DIAGNOSIS — I10 Essential (primary) hypertension: Secondary | ICD-10-CM | POA: Diagnosis not present

## 2014-12-24 DIAGNOSIS — F0390 Unspecified dementia without behavioral disturbance: Secondary | ICD-10-CM | POA: Diagnosis not present

## 2014-12-24 DIAGNOSIS — Z79899 Other long term (current) drug therapy: Secondary | ICD-10-CM | POA: Diagnosis not present

## 2014-12-24 DIAGNOSIS — Z87891 Personal history of nicotine dependence: Secondary | ICD-10-CM | POA: Diagnosis not present

## 2014-12-24 DIAGNOSIS — Z8669 Personal history of other diseases of the nervous system and sense organs: Secondary | ICD-10-CM | POA: Insufficient documentation

## 2014-12-24 DIAGNOSIS — E162 Hypoglycemia, unspecified: Secondary | ICD-10-CM

## 2014-12-24 DIAGNOSIS — I2510 Atherosclerotic heart disease of native coronary artery without angina pectoris: Secondary | ICD-10-CM | POA: Insufficient documentation

## 2014-12-24 DIAGNOSIS — R4182 Altered mental status, unspecified: Secondary | ICD-10-CM

## 2014-12-24 DIAGNOSIS — N390 Urinary tract infection, site not specified: Secondary | ICD-10-CM | POA: Diagnosis not present

## 2014-12-24 DIAGNOSIS — Z7902 Long term (current) use of antithrombotics/antiplatelets: Secondary | ICD-10-CM | POA: Insufficient documentation

## 2014-12-24 DIAGNOSIS — R079 Chest pain, unspecified: Secondary | ICD-10-CM | POA: Diagnosis present

## 2014-12-24 LAB — I-STAT TROPONIN, ED: TROPONIN I, POC: 0 ng/mL (ref 0.00–0.08)

## 2014-12-24 LAB — COMPREHENSIVE METABOLIC PANEL
ALT: 28 U/L (ref 0–53)
ANION GAP: 12 (ref 5–15)
AST: 30 U/L (ref 0–37)
Albumin: 3.4 g/dL — ABNORMAL LOW (ref 3.5–5.2)
Alkaline Phosphatase: 76 U/L (ref 39–117)
BUN: 25 mg/dL — ABNORMAL HIGH (ref 6–23)
CO2: 26 mmol/L (ref 19–32)
CREATININE: 1.3 mg/dL (ref 0.50–1.35)
Calcium: 9.9 mg/dL (ref 8.4–10.5)
Chloride: 104 mmol/L (ref 96–112)
GFR calc Af Amer: 65 mL/min — ABNORMAL LOW (ref >90)
GFR calc non Af Amer: 56 mL/min — ABNORMAL LOW (ref >90)
Glucose, Bld: 86 mg/dL (ref 70–99)
Potassium: 4.4 mmol/L (ref 3.5–5.1)
SODIUM: 142 mmol/L (ref 135–145)
TOTAL PROTEIN: 7.4 g/dL (ref 6.0–8.3)
Total Bilirubin: 0.5 mg/dL (ref 0.3–1.2)

## 2014-12-24 LAB — I-STAT CHEM 8, ED
BUN: 28 mg/dL — AB (ref 6–23)
CALCIUM ION: 1.32 mmol/L — AB (ref 1.13–1.30)
CREATININE: 1.1 mg/dL (ref 0.50–1.35)
Chloride: 106 mmol/L (ref 96–112)
Glucose, Bld: 86 mg/dL (ref 70–99)
HCT: 41 % (ref 39.0–52.0)
HEMOGLOBIN: 13.9 g/dL (ref 13.0–17.0)
Potassium: 4.4 mmol/L (ref 3.5–5.1)
Sodium: 143 mmol/L (ref 135–145)
TCO2: 25 mmol/L (ref 0–100)

## 2014-12-24 LAB — URINALYSIS, ROUTINE W REFLEX MICROSCOPIC
BILIRUBIN URINE: NEGATIVE
GLUCOSE, UA: NEGATIVE mg/dL
Hgb urine dipstick: NEGATIVE
Ketones, ur: NEGATIVE mg/dL
Nitrite: POSITIVE — AB
Protein, ur: 100 mg/dL — AB
Specific Gravity, Urine: 1.016 (ref 1.005–1.030)
Urobilinogen, UA: 0.2 mg/dL (ref 0.0–1.0)
pH: 5.5 (ref 5.0–8.0)

## 2014-12-24 LAB — CBG MONITORING, ED
GLUCOSE-CAPILLARY: 111 mg/dL — AB (ref 70–99)
GLUCOSE-CAPILLARY: 61 mg/dL — AB (ref 70–99)
Glucose-Capillary: 105 mg/dL — ABNORMAL HIGH (ref 70–99)
Glucose-Capillary: 78 mg/dL (ref 70–99)

## 2014-12-24 LAB — URINE MICROSCOPIC-ADD ON

## 2014-12-24 LAB — ETHANOL: Alcohol, Ethyl (B): <5 mg/dL (ref 0–9)

## 2014-12-24 MED ORDER — DEXTROSE 5 % IV SOLN
1.0000 g | Freq: Once | INTRAVENOUS | Status: AC
  Administered 2014-12-24: 1 g via INTRAVENOUS
  Filled 2014-12-24: qty 10

## 2014-12-24 MED ORDER — CEPHALEXIN 500 MG PO CAPS: 500.0000 mg | ORAL_CAPSULE | Freq: Four times a day (QID) | ORAL | Status: AC

## 2014-12-24 NOTE — ED Notes (Signed)
CBG 111 

## 2014-12-24 NOTE — Discharge Instructions (Signed)
Please call your doctor for a followup appointment within 24-48 hours. When you talk to your doctor please let them know that you were seen in the emergency department and have them acquire all of your records so that they can discuss the findings with you and formulate a treatment plan to fully care for your new and ongoing problems. ° °

## 2014-12-24 NOTE — ED Provider Notes (Signed)
CSN: [id number]     Arrival date & time 12/24/14  1439 History   First MD Initiated Contact with Patient 12/24/14 1448     Chief Complaint  Patient presents with  . Chest Pain    hypoglycemia     (Consider location/radiation/quality/duration/timing/severity/associated sxs/prior Treatment) HPI Comments: The patient is a 64 year old male, he has a history of dementia, diabetes hypertension and coronary disease as well as alcohol abuse. The patient presents to the hospital with a complaint of altered mental status and was found to have a low blood sugar at home. His sister who was at the house with him stated that he had appeared diaphoretic, he was sitting down while doing laundry, this was abnormal for him, his blood sugar was measured at 56 by paramedics who gave the patient D50, IV fluids and had him workup and return to baseline. The patient does take glipizide and reportedly metformin, he has unable to give any history due to his dementia. He has no complaints when asked if anything hurts, denies nausea, unsure what he ate for breakfast  Patient is a 64 y.o. male presenting with chest pain. The history is provided by the patient, the EMS personnel and medical records.  Chest Pain   Past Medical History  Diagnosis Date  . Dementia   . Diabetes mellitus   . Hypertension   . Coronary artery disease   . Insomnia   . Hernia   . Alcohol abuse    History reviewed. No pertinent past surgical history. No family history on file. History  Substance Use Topics  . Smoking status: Former Games developer  . Smokeless tobacco: Not on file  . Alcohol Use: No    Review of Systems  Unable to perform ROS: Dementia  Cardiovascular: Positive for chest pain.      Allergies  Review of patient's allergies indicates no known allergies.  Home Medications   Prior to Admission medications   Medication Sig Start Date End Date Taking? Authorizing Provider  cephALEXin (KEFLEX) 500 MG capsule Take 1  capsule (500 mg total) by mouth 4 (four) times daily. 12/24/14   Eber Hong, MD  cetirizine (ZYRTEC) 10 MG tablet Take 10 mg by mouth daily as needed for allergies.    Historical Provider, MD  clopidogrel (PLAVIX) 75 MG tablet Take 1 tablet (75 mg total) by mouth daily. 07/20/14   Charm Rings, NP  fenofibrate (TRICOR) 48 MG tablet Take 1 tablet (48 mg total) by mouth at bedtime. 07/20/14   Charm Rings, NP  glipiZIDE (GLUCOTROL) 5 MG tablet Take 1 tablet (5 mg total) by mouth 2 (two) times daily. 07/20/14   Charm Rings, NP  lisinopril-hydrochlorothiazide (PRINZIDE,ZESTORETIC) 10-12.5 MG per tablet Take 1 tablet by mouth every morning. 07/20/14   Charm Rings, NP  metoprolol (LOPRESSOR) 50 MG tablet Take 1 tablet (50 mg total) by mouth 2 (two) times daily. 07/20/14   Charm Rings, NP  pseudoephedrine (SUDAFED 12 HOUR) 120 MG 12 hr tablet Take 1 tablet (120 mg total) by mouth every 12 (twelve) hours. 10/28/14   Linwood Dibbles, MD  risperiDONE (RISPERDAL) 0.5 MG tablet Take 1 tablet (0.5 mg total) by mouth 2 (two) times daily. 07/20/14   Charm Rings, NP   BP 133/72 mmHg  Pulse 52  Temp(Src) 97.3 F (36.3 C) (Oral)  Resp 19  Ht  (1.803 m)  Wt 145 lb (65.772 kg)  BMI 20.23 kg/m2  SpO2 100% Physical Exam  Constitutional: He  appears well-developed and well-nourished. No distress.  HENT:  Head: Normocephalic and atraumatic.  Mouth/Throat: Oropharynx is clear and moist. No oropharyngeal exudate.  Eyes: Conjunctivae and EOM are normal. Pupils are equal, round, and reactive to light. Right eye exhibits no discharge. Left eye exhibits no discharge. No scleral icterus.  Neck: Normal range of motion. Neck supple. No JVD present. No thyromegaly present.  Cardiovascular: Normal rate, regular rhythm, normal heart sounds and intact distal pulses.  Exam reveals no gallop and no friction rub.   No murmur heard. Pulmonary/Chest: Effort normal and breath sounds normal. No respiratory distress.  He has no wheezes. He has no rales.  Abdominal: Soft. Bowel sounds are normal. He exhibits no distension and no mass. There is no tenderness.  Musculoskeletal: Normal range of motion. He exhibits no edema or tenderness.  Lymphadenopathy:    He has no cervical adenopathy.  Neurological: He is alert. Coordination normal.  Skin: Skin is warm and dry. No rash noted. No erythema.  Psychiatric: He has a normal mood and affect. His behavior is normal.  Nursing note and vitals reviewed.   ED Course  Procedures (including critical care time) Labs Review Labs Reviewed  URINALYSIS, ROUTINE W REFLEX MICROSCOPIC - Abnormal; Notable for the following:    APPearance CLOUDY (*)    Protein, ur 100 (*)    Nitrite POSITIVE (*)    Leukocytes, UA SMALL (*)    All other components within normal limits  COMPREHENSIVE METABOLIC PANEL - Abnormal; Notable for the following:    BUN 25 (*)    Albumin 3.4 (*)    GFR calc non Af Amer 56 (*)    GFR calc Af Amer 65 (*)    All other components within normal limits  URINE MICROSCOPIC-ADD ON - Abnormal; Notable for the following:    Bacteria, UA MANY (*)    All other components within normal limits  I-STAT CHEM 8, ED - Abnormal; Notable for the following:    BUN 28 (*)    Calcium, Ion 1.32 (*)    All other components within normal limits  CBG MONITORING, ED - Abnormal; Notable for the following:    Glucose-Capillary 105 (*)    All other components within normal limits  CBG MONITORING, ED - Abnormal; Notable for the following:    Glucose-Capillary 111 (*)    All other components within normal limits  CBG MONITORING, ED - Abnormal; Notable for the following:    Glucose-Capillary 61 (*)    All other components within normal limits  URINE CULTURE  ETHANOL  I-STAT TROPOININ, ED  CBG MONITORING, ED    Imaging Review Ct Head Wo Contrast  12/24/2014   CLINICAL DATA:  Altered mental status.  EXAM: CT HEAD WITHOUT CONTRAST  TECHNIQUE: Contiguous axial images  were obtained from the base of the skull through the vertex without intravenous contrast.  COMPARISON:  07/08/2007  FINDINGS: Skull and Sinuses:Negative for fracture or destructive process. The mastoids, middle ears, and imaged paranasal sinuses are clear.  Orbits: No acute abnormality.  Brain: Sub cm low-density in the central left thalamus which is new from 2008.  There is generalized brain atrophy with notable absence of sulcal widening at the vertex. Ventricular enlargement is commensurate. There is no cortical infarct, hemorrhage, hydrocephalus, or mass lesion.  IMPRESSION: 1. 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 2. Brain atrophy, stable from 2008.   Electronically Signed   By: Marnee Spring M.D.   On: 12/24/2014 16:16  EKG Interpretation   Date/Time:  Thursday December 24 2014 20:08:26 EDT Ventricular Rate:  55 PR Interval:  177 QRS Duration: 93 QT Interval:  443 QTC Calculation: 424 R Axis:   60 Text Interpretation:  Sinus rhythm Atrial premature complex Left atrial  enlargement Anteroseptal infarct, age indeterminate since last tracing no  significant change Abnormal ekg Confirmed by Hyacinth MeekerMILLER  MD, Keandre Linden ([id number]) on  12/24/2014 8:11:07 PM      MDM   Final diagnoses:  Altered mental status  UTI (lower urinary tract infection)  Hypoglycemia    Other than having mild rhinorrhea there are no other abnormal findings on exam, he has a mild bradycardia but of note the patient is on metoprolol., We'll check vital signs, labs, EKG, reevaluate. Hope to discuss case with his sister for more information.  3:00 PM, discussed the case with the patient's sister who states that he had a normal breakfast including sausage, pancakes this morning, had been his normal self until he was doing laundry when she found him slumped over a chair with decreased level of consciousness, this was very abnormal for him, he has not done this in the past, she does help him take his  medications, she does not think that he has taken too much of his medications because of that.  D/w family - informed of results - tolerated normal meal - no more hypoglycemia - VS stable - UA + for UTI, meds given, stable for d/c - family agrees with plan.  Meds given in ED:  Medications  cefTRIAXone (ROCEPHIN) 1 g in dextrose 5 % 50 mL IVPB (0 g Intravenous Stopped 12/24/14 1905)    Discharge Medication List as of 12/24/2014  7:57 PM    START taking these medications   Details  cephALEXin (KEFLEX) 500 MG capsule Take 1 capsule (500 mg total) by mouth 4 (four) times daily., Starting 12/24/2014, Until Discontinued, Print          Eber HongBrian Marcial Pless, MD 12/25/14 [phone]

## 2014-12-24 NOTE — ED Notes (Signed)
Pt given [place] sandwich with graham crackers and peanut butter and cup of orange juice.

## 2014-12-24 NOTE — ED Notes (Signed)
Pt A&OX4, ambulatory at d/c but wheeled out of ED via wheelchair, NAD 

## 2014-12-24 NOTE — ED Notes (Signed)
CBG 78. 

## 2014-12-24 NOTE — ED Notes (Signed)
Sister called EMS stating that pt was not talking to her.  Initially she thought it was cardiac d/t his hIstory b/c he was diaphoretic.  When EMS arrived pt was warm to touch, unresponsive (staring) and cbg was 56.  Pt was give 1 amp of D50 and began responding to voice.  Pt able to move all extremities and follow commands.  Hx of dementia.

## 2014-12-26 LAB — URINE CULTURE: SPECIAL REQUESTS: NORMAL

## 2016-08-11 IMAGING — CT CT HEAD W/O CM
2 series · 16 of 30 positions shown, 20 images · non-contrast
Comparison: 07/08/2007

CLINICAL DATA: Altered mental status.

EXAM:
CT HEAD WITHOUT CONTRAST
TECHNIQUE: Contiguous axial images were obtained from the base of the skull
through the vertex without intravenous contrast.

[Series 201: head w/o, idose (1) · axial · non-contrast · 0.44mm/px · z∈[+688,+833]mm · 13 of 35 slices shown, 17 images]
[im 3/35  brain]
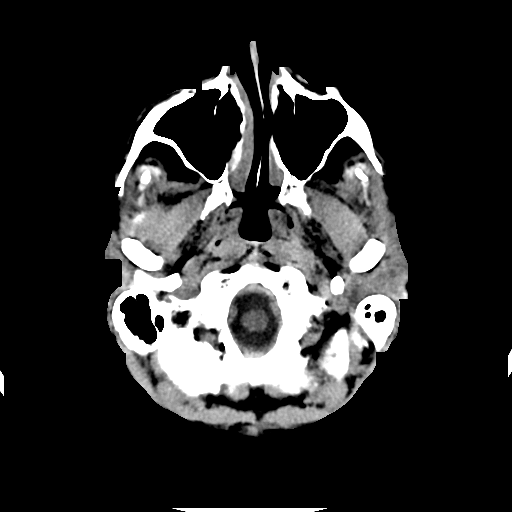
[im 3/35  bone]
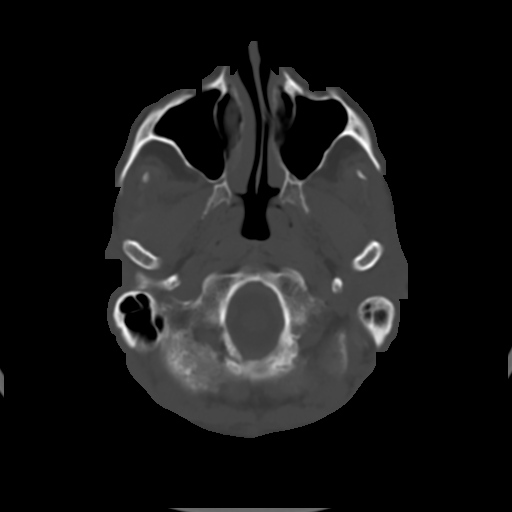
[im 5/35  brain]
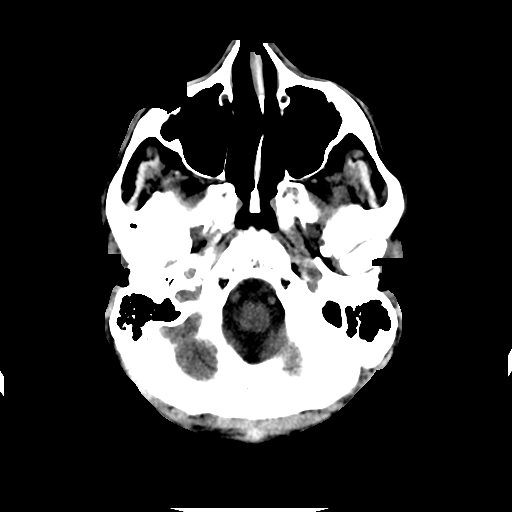
[im 8/35  brain]
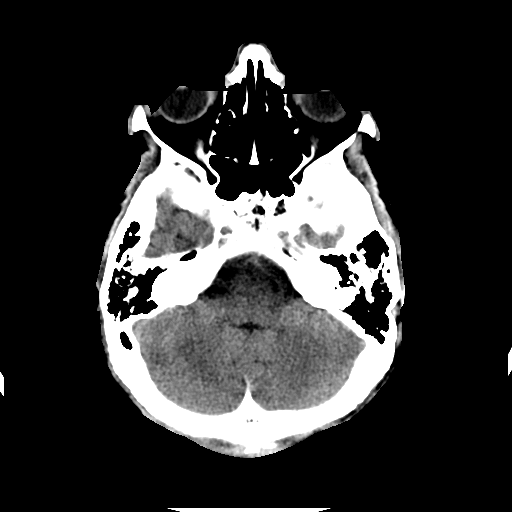
[im 10/35  brain]
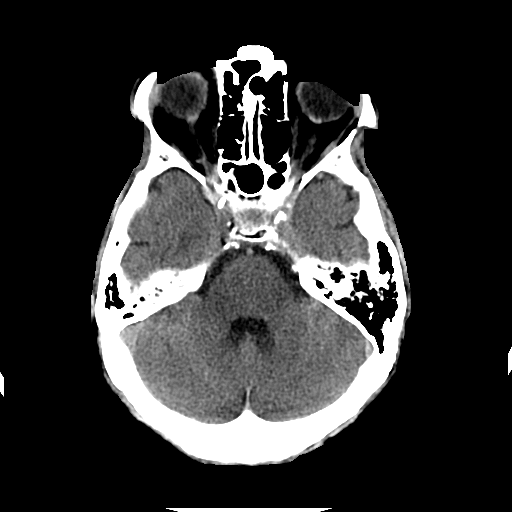
[im 13/35  brain]
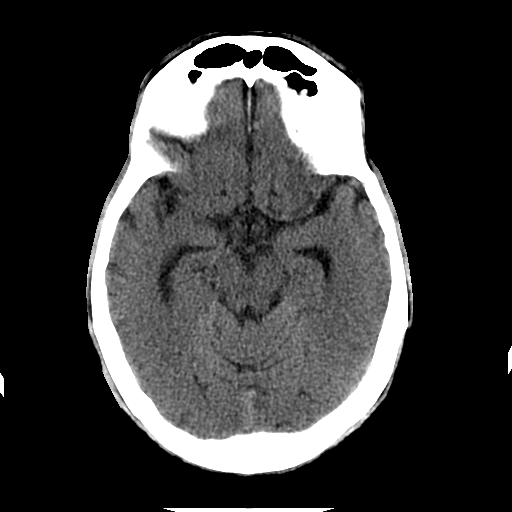
[im 13/35  bone]
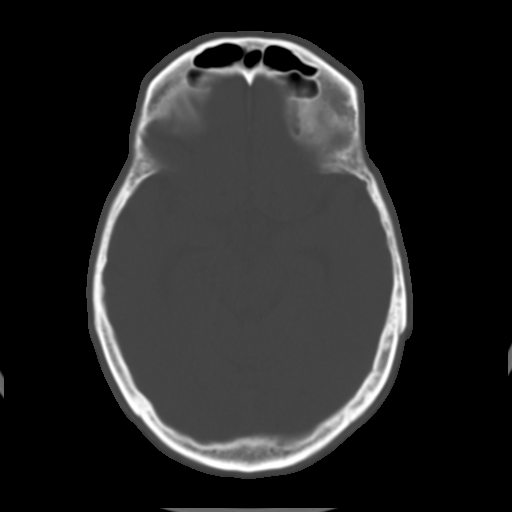
[im 15/35  brain]
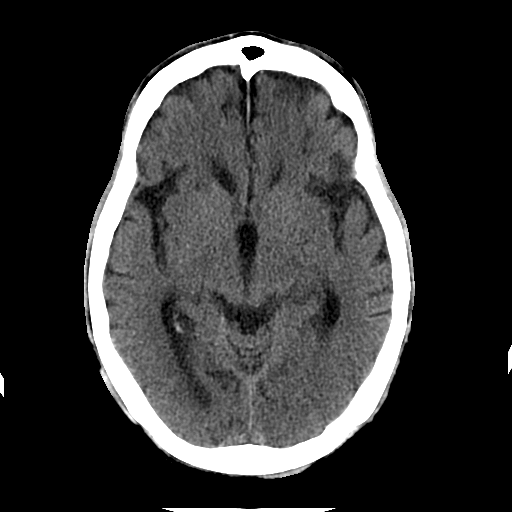
[im 18/35  brain]
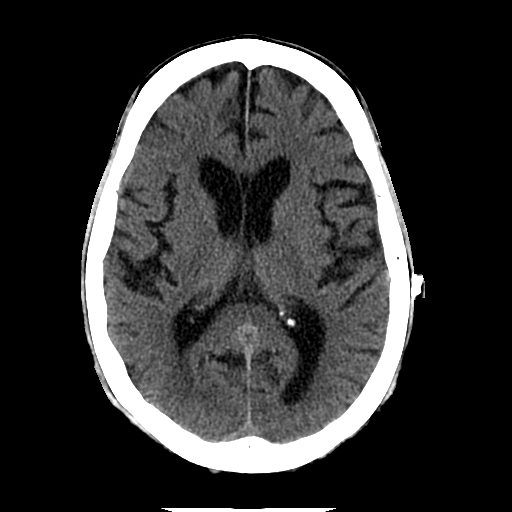
[im 20/35  brain]
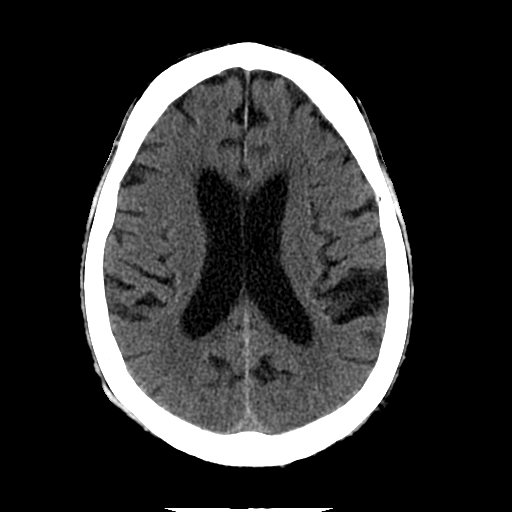
[im 22/35  brain]
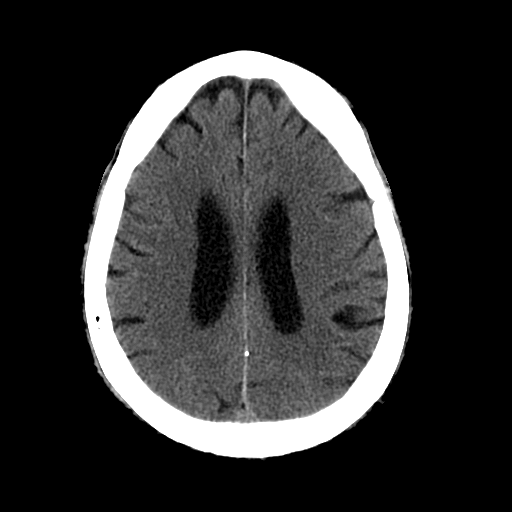
[im 22/35  bone]
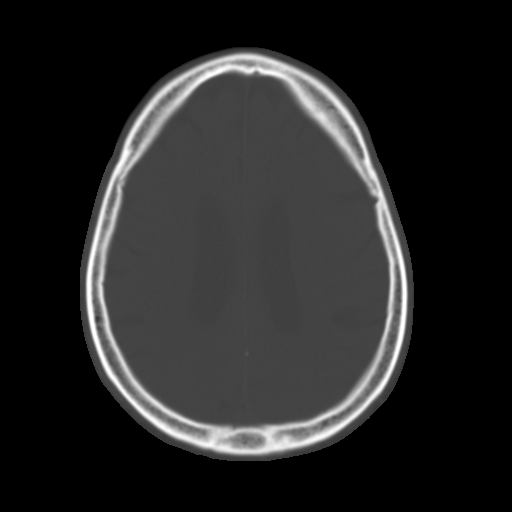
[im 25/35  brain]
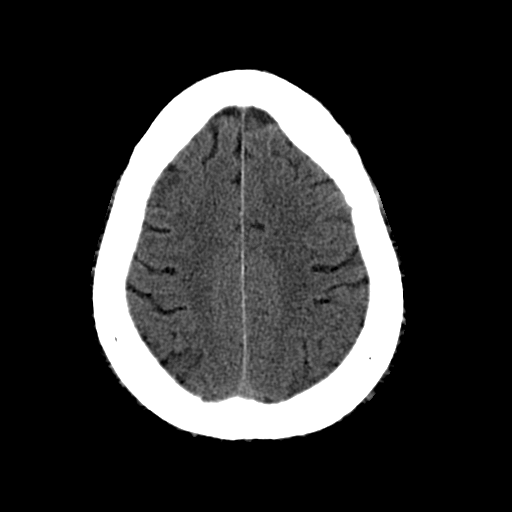
[im 27/35  brain]
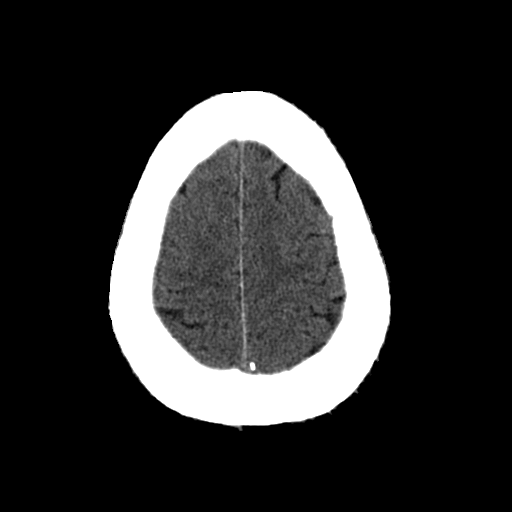
[im 30/35  brain]
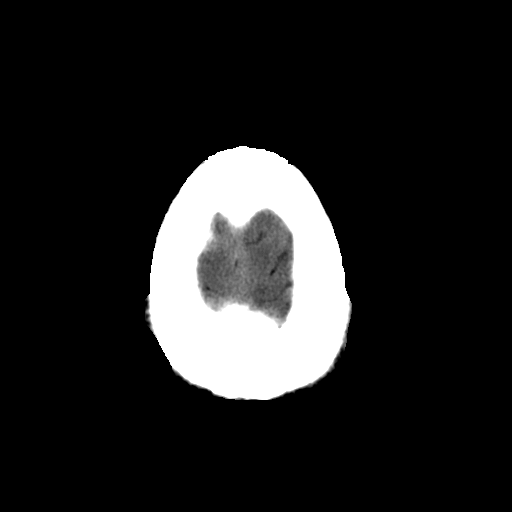
[im 32/35  brain]
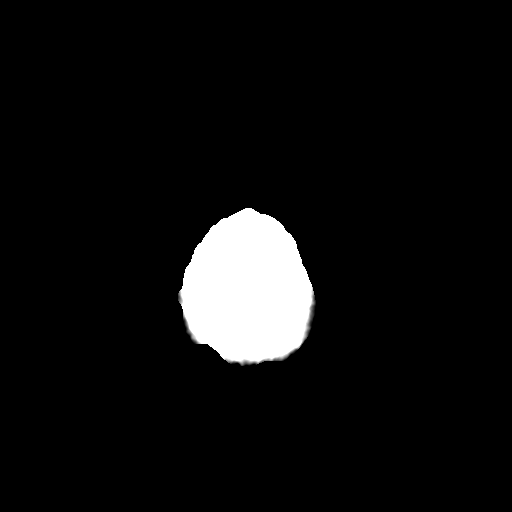
[im 32/35  bone]
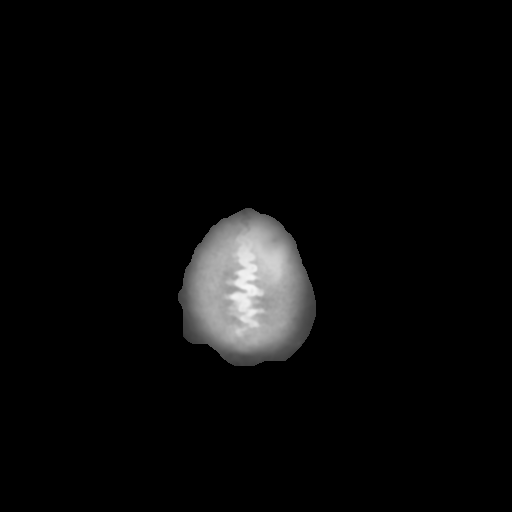

[Series 202: head w/o bone, idose (1) · axial · non-contrast · 0.44mm/px · z∈[+688,+738]mm · 3 of 35 slices shown]
[im 3/35  bone]
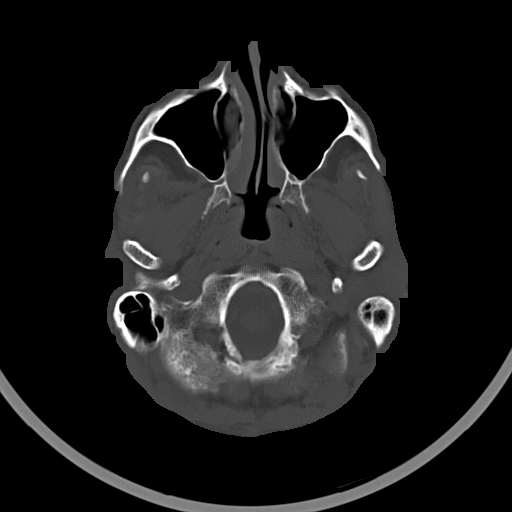
[im 8/35  bone]
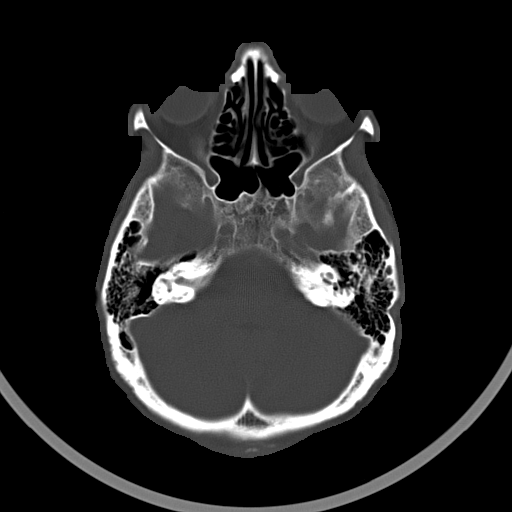
[im 13/35  bone]
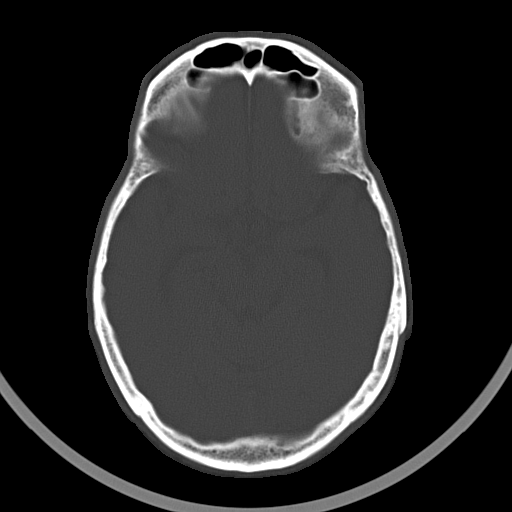

[16 of 30 positions shown; findings below may reference images not displayed]

FINDINGS: Skull and Sinuses:Negative for fracture or destructive process. The
mastoids, middle ears, and imaged paranasal sinuses are clear.

Orbits: No acute abnormality.

Brain: Sub cm low-density in the central left thalamus which is new
from 0006.

There is generalized brain atrophy with notable absence of sulcal
widening at the vertex. Ventricular enlargement is commensurate.
There is no cortical infarct, hemorrhage, hydrocephalus, or mass
lesion.
IMPRESSION: 1. Age indeterminate small vessel infarct in the left thalamus.
2. Brain atrophy, stable from 0006.

## 2019-03-10 DEATH — deceased
